# Patient Record
Sex: Female | Born: 1990 | Race: White | Hispanic: No | Marital: Single
Health system: Southern US, Community
[De-identification: ages and names within clinical notes are randomized; demographics above are authoritative.]

---

## 2022-04-13 ENCOUNTER — Emergency Department (HOSPITAL_COMMUNITY): Payer: Self-pay

## 2022-04-13 ENCOUNTER — Emergency Department (HOSPITAL_COMMUNITY)
Admission: EM | Admit: 2022-04-13 | Discharge: 2022-04-13 | Disposition: A | Discharge status: Home / Self Care | Payer: Self-pay | Attending: Emergency Medicine | Admitting: Emergency Medicine

## 2022-04-13 DIAGNOSIS — M79641 Pain in right hand: Secondary | ICD-10-CM | POA: Insufficient documentation

## 2022-04-13 DIAGNOSIS — R030 Elevated blood-pressure reading, without diagnosis of hypertension: Secondary | ICD-10-CM | POA: Insufficient documentation

## 2022-04-13 NOTE — ED Triage Notes (Signed)
Pt c/o R hand pain since last Thursday. 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 PMS intact during triage. Pt has a soft wrist brace on.

## 2022-04-13 NOTE — ED Provider Notes (Signed)
[facility]-EMERGENCY DEPT Provider Note   CSN: [id number] Arrival date & time: 04/13/22  1404     History  Chief Complaint  Patient presents with   Hand Pain    Kristine Guerra is a 31 y.o. female who presents today for evaluation of atraumatic right hand pain.  She states that this started after she had a job interview on Thursday and was building a pallet.  She states that her hand has been hurting since. She has been trying to wear a soft wrist brace and unsure if this is helping or not.  She states it is hurting more than she expected.   HPI     Home Medications Prior to Admission medications   Not on File      Allergies    Patient has no known allergies.    Review of Systems   Review of Systems See above Physical Exam Updated Vital Signs BP (!) 140/99 (BP Location: Left Arm)   Pulse (!) 106   Temp 98 F (36.7 C)   Resp 16   Ht 5\' 2"  (1.575 m)   Wt 88.5 kg   SpO2 99%   BMI 35.67 kg/m  Physical Exam Vitals and nursing note reviewed.  Constitutional:      General: She is not in acute distress. HENT:     Head: Normocephalic and atraumatic.  Cardiovascular:     Rate and Rhythm: Normal rate.     Pulses: Normal pulses.  Pulmonary:     Effort: Pulmonary effort is normal. No respiratory distress.  Musculoskeletal:     Cervical back: No rigidity.     Comments: There right hand are mildly swollen around the outline of the brace patient has been wearing.  Patient asked to remove her rings and instructed to keep these off. There is TTP over the space in between the right fourth and fifth metacarpals.   Skin:    Comments: No contusion or abnormal erythema noted over the right hand.  Neurological:     Mental Status: She is alert. Mental status is at baseline.     Sensory: No sensory deficit.     Comments: Awake and alert, answers all questions appropriately.  Speech is not slurred.    Psychiatric:        Mood and Affect: Mood normal.     ED Results / Procedures / Treatments   Labs (all labs ordered are listed, but only abnormal results are displayed) Labs Reviewed - No data to display  EKG None  Radiology DG Hand Complete Right  Result Date: 04/13/2022 CLINICAL DATA:  Injury.  Hand swelling EXAM: RIGHT HAND - COMPLETE 3+ VIEW COMPARISON:  None Available. FINDINGS: There is no evidence of fracture or dislocation. There is no evidence of arthropathy or other focal bone abnormality. Soft tissues are unremarkable. IMPRESSION: Negative. Electronically Signed   By: 04/15/2022 M.D.   On: 04/13/2022 14:31    Procedures Procedures    Medications Ordered in ED Medications - No data to display  ED Course/ Medical Decision Making/ A&P                           Medical Decision Making Patient is a 31 year old woman who presents today for evaluation of atraumatic pain of the right hand.  This started after she built a pallet on a job interview.  X-rays without fracture.  Clinically she does not have signs of infection. I  suspect strain versus contusion. She was instructed not to wear rings on this hand.  I suspect that the edema she has into her fingers is related to wearing the compressive brace she has had on as this stopped at the base of all the fingers and she is not having pain in her fingers despite these mild swelling there.  She is neurovascularly intact. Recommended that she stop wearing the brace, conservative care  Patient's heart rate was slightly elevated.  This improved while she was in the emergency room and I suspect is more related to pain and anxiety of being in the emergency room. Her blood pressure was slightly elevated.  She is instructed to get this rechecked in the next month.  Return precautions were discussed with patient who states their understanding.  At the time of discharge patient denied any unaddressed complaints or concerns.  Patient is agreeable for discharge home.  Note: Portions of  this report may have been transcribed using voice recognition software. Every effort was made to ensure accuracy; however, inadvertent computerized transcription errors may be present    Problems Addressed: Elevated blood pressure reading: undiagnosed new problem with uncertain prognosis Right hand pain: acute illness or injury  Amount and/or Complexity of Data Reviewed Radiology: ordered and independent interpretation performed.    Details: No fracture noted  Risk OTC drugs. Decision regarding hospitalization.  Final Clinical Impression(s) / ED Diagnoses Final diagnoses:  Right hand pain  Elevated blood pressure reading    Rx / DC Orders ED Discharge Orders     None         Cristina Gong, PA-C 04/14/22 1236    Melene Plan, DO 04/16/22 [phone]

## 2022-04-13 NOTE — Discharge Instructions (Addendum)
Please take Ibuprofen (Advil, motrin) and Tylenol (acetaminophen) to relieve your pain.   ° °You may take up to 600 MG (3 pills) of normal strength ibuprofen every 8 hours as needed.   °You make take tylenol, up to 1,000 mg (two extra strength pills) every 8 hours as needed.  ° °It is safe to take ibuprofen and tylenol at the same time as they work differently.  ° Do not take more than 3,000 mg tylenol in a 24 hour period (not more than one dose every 8 hours.  Please check all medication labels as many medications such as pain and cold medications may contain tylenol.  Do not drink alcohol while taking these medications.  Do not take other NSAID'S while taking ibuprofen (such as aleve or naproxen).  Please take ibuprofen with food to decrease stomach upset. ° °While in the ED your blood pressure was high.  Please follow up with your primary care doctor or the wellness clinic for repeat evaluation as you may need medication.  High blood pressure can cause long term, potentially serious, damage if left untreated.  ° °

## 2023-05-23 IMAGING — CR DG HAND COMPLETE 3+V*R*
3 series · 3 of 3 positions shown · non-contrast
Comparison: None Available.

CLINICAL DATA: Injury.  Hand swelling

EXAM:
RIGHT HAND - COMPLETE 3+ VIEW

[x hand pa right]
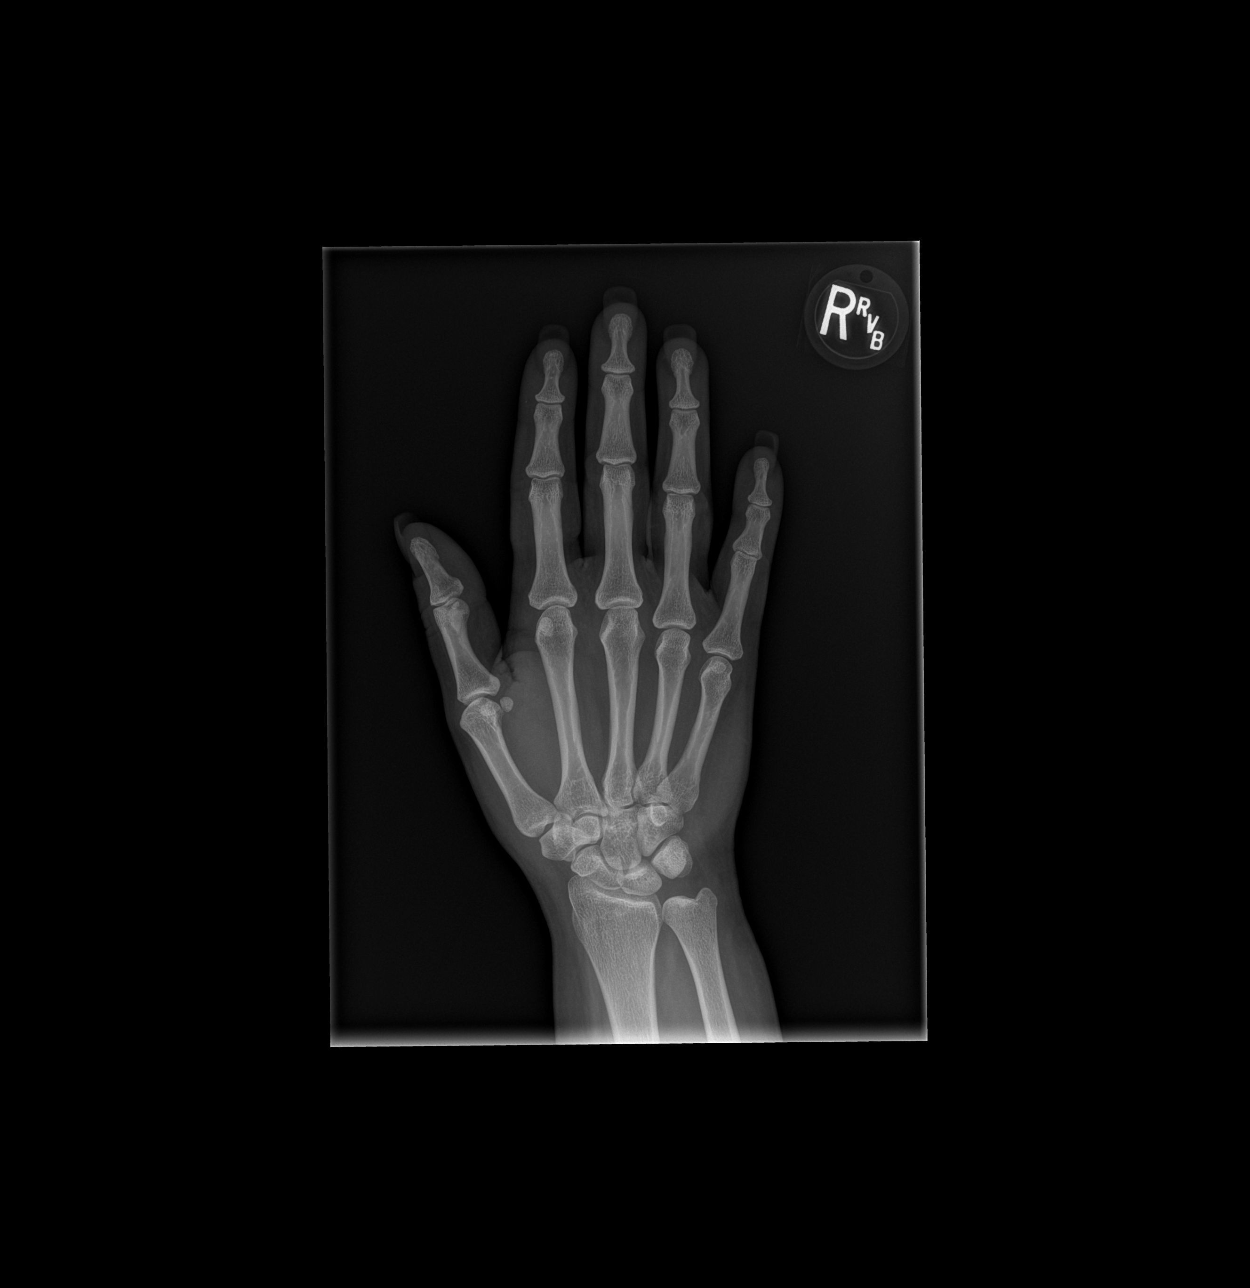

[x hand obl right]
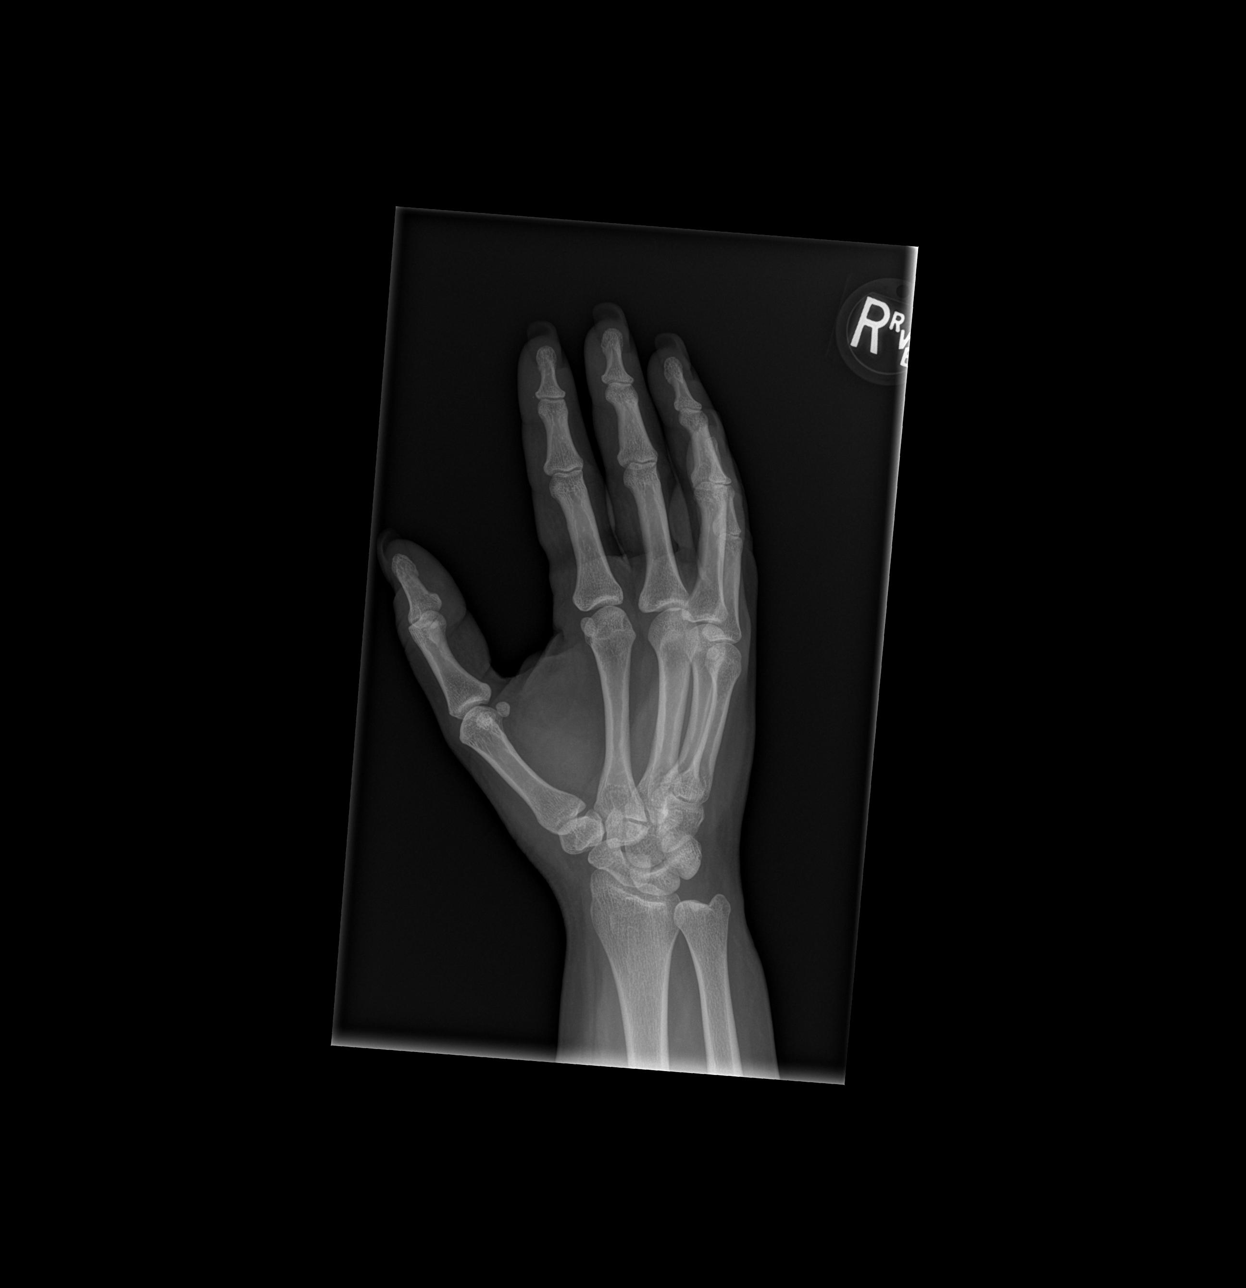

[x hand lat right]
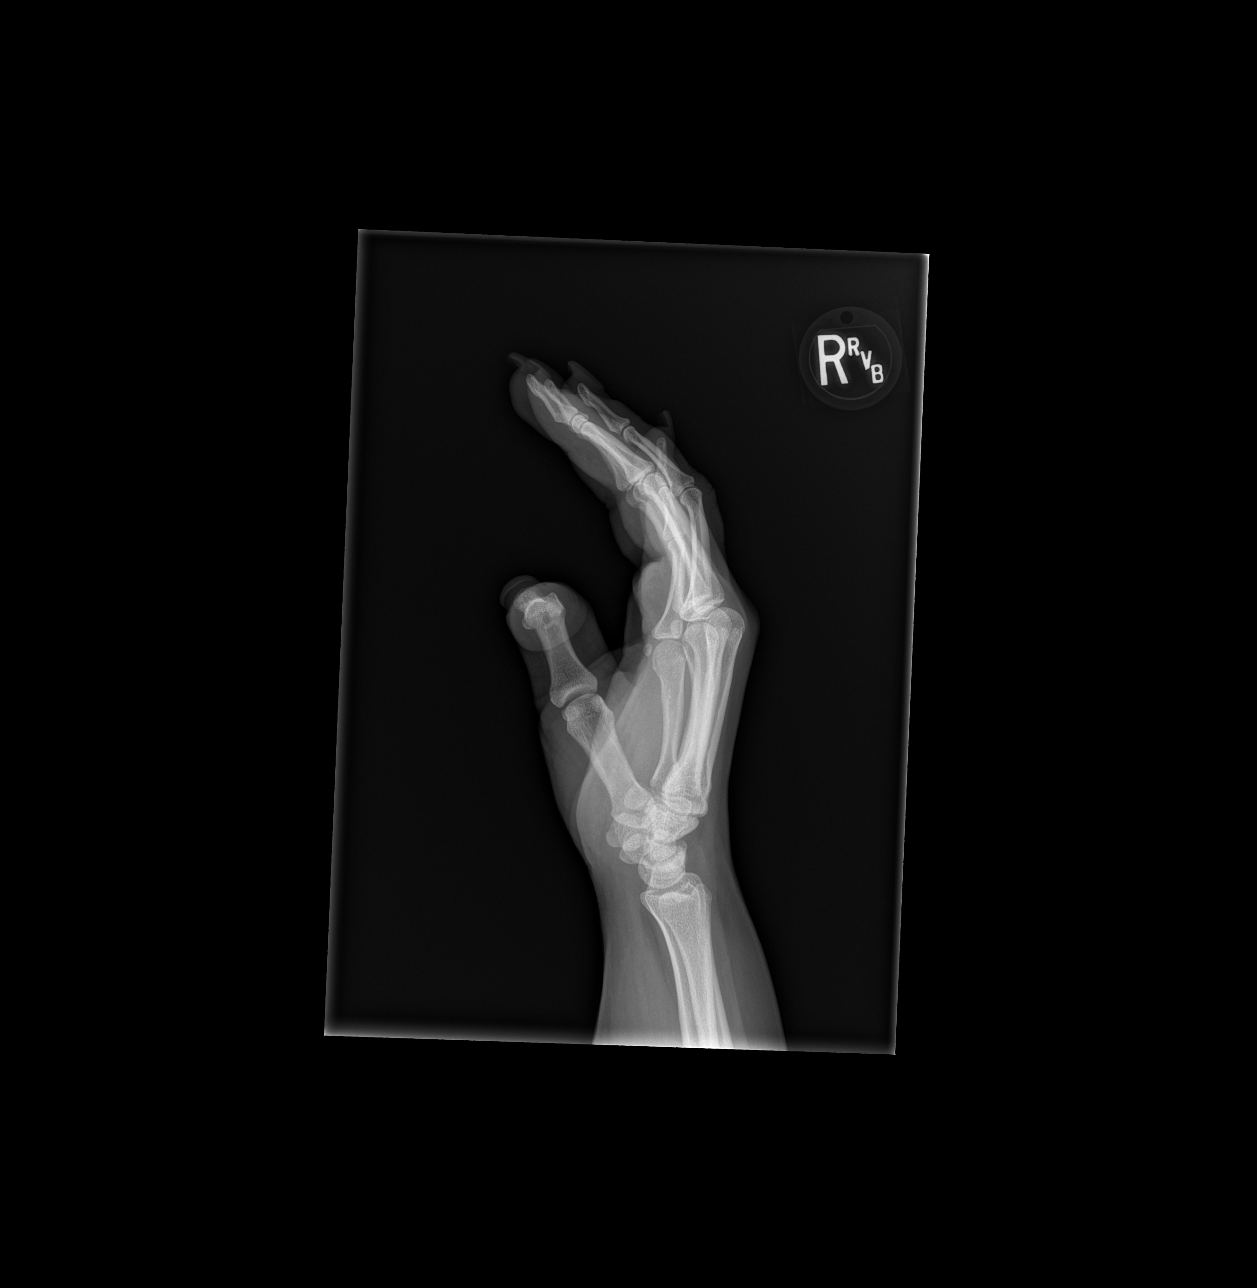

[3 of 3 positions shown; findings below may reference images not displayed]

FINDINGS: There is no evidence of fracture or dislocation. There is no
evidence of arthropathy or other focal bone abnormality. Soft
tissues are unremarkable.
IMPRESSION: Negative.
